# Patient Record
Sex: Female | Born: 1960 | Hispanic: No | Marital: Married | Smoking: Current every day smoker
Health system: Southern US, Community
[De-identification: ages and names within clinical notes are randomized; demographics above are authoritative.]

## PROBLEM LIST (undated history)

## (undated) DIAGNOSIS — F329 Major depressive disorder, single episode, unspecified: Secondary | ICD-10-CM

## (undated) DIAGNOSIS — G8929 Other chronic pain: Secondary | ICD-10-CM

## (undated) DIAGNOSIS — M25473 Effusion, unspecified ankle: Secondary | ICD-10-CM

## (undated) DIAGNOSIS — F32A Depression, unspecified: Secondary | ICD-10-CM

## (undated) DIAGNOSIS — M545 Low back pain: Secondary | ICD-10-CM

## (undated) DIAGNOSIS — M797 Fibromyalgia: Secondary | ICD-10-CM

## (undated) DIAGNOSIS — I10 Essential (primary) hypertension: Secondary | ICD-10-CM

## (undated) DIAGNOSIS — M549 Dorsalgia, unspecified: Secondary | ICD-10-CM

## (undated) DIAGNOSIS — E7800 Pure hypercholesterolemia, unspecified: Secondary | ICD-10-CM

## (undated) HISTORY — DX: Low back pain: M54.5

## (undated) HISTORY — DX: Pure hypercholesterolemia, unspecified: E78.00

## (undated) HISTORY — DX: Other chronic pain: G89.29

## (undated) HISTORY — DX: Depression, unspecified: F32.A

## (undated) HISTORY — PX: CARPAL TUNNEL RELEASE: SHX101

## (undated) HISTORY — DX: Effusion, unspecified ankle: M25.473

## (undated) HISTORY — PX: CHOLECYSTECTOMY: SHX55

## (undated) HISTORY — DX: Major depressive disorder, single episode, unspecified: F32.9

---

## 2014-05-09 ENCOUNTER — Emergency Department (HOSPITAL_COMMUNITY): Payer: Self-pay

## 2014-05-09 ENCOUNTER — Encounter (HOSPITAL_COMMUNITY): Payer: Self-pay | Admitting: Emergency Medicine

## 2014-05-09 ENCOUNTER — Emergency Department (HOSPITAL_COMMUNITY)
Admission: EM | Admit: 2014-05-09 | Discharge: 2014-05-09 | Disposition: A | Discharge status: Home / Self Care | Payer: Self-pay | Attending: Emergency Medicine | Admitting: Emergency Medicine

## 2014-05-09 DIAGNOSIS — F17200 Nicotine dependence, unspecified, uncomplicated: Secondary | ICD-10-CM | POA: Insufficient documentation

## 2014-05-09 DIAGNOSIS — Y9389 Activity, other specified: Secondary | ICD-10-CM | POA: Insufficient documentation

## 2014-05-09 DIAGNOSIS — M549 Dorsalgia, unspecified: Secondary | ICD-10-CM

## 2014-05-09 DIAGNOSIS — IMO0002 Reserved for concepts with insufficient information to code with codable children: Secondary | ICD-10-CM | POA: Insufficient documentation

## 2014-05-09 DIAGNOSIS — Y9289 Other specified places as the place of occurrence of the external cause: Secondary | ICD-10-CM | POA: Insufficient documentation

## 2014-05-09 DIAGNOSIS — G8929 Other chronic pain: Secondary | ICD-10-CM | POA: Insufficient documentation

## 2014-05-09 DIAGNOSIS — W1809XA Striking against other object with subsequent fall, initial encounter: Secondary | ICD-10-CM | POA: Insufficient documentation

## 2014-05-09 DIAGNOSIS — R52 Pain, unspecified: Secondary | ICD-10-CM | POA: Insufficient documentation

## 2014-05-09 DIAGNOSIS — M79609 Pain in unspecified limb: Secondary | ICD-10-CM | POA: Insufficient documentation

## 2014-05-09 DIAGNOSIS — Z79899 Other long term (current) drug therapy: Secondary | ICD-10-CM | POA: Insufficient documentation

## 2014-05-09 DIAGNOSIS — I10 Essential (primary) hypertension: Secondary | ICD-10-CM | POA: Insufficient documentation

## 2014-05-09 HISTORY — DX: Dorsalgia, unspecified: M54.9

## 2014-05-09 HISTORY — DX: Essential (primary) hypertension: I10

## 2014-05-09 HISTORY — DX: Fibromyalgia: M79.7

## 2014-05-09 MED ORDER — MELOXICAM 15 MG PO TABS: 15.0000 mg | ORAL_TABLET | Freq: Every day | ORAL | Status: DC

## 2014-05-09 NOTE — ED Notes (Signed)
Patient discharged using the teach back method She verbalized an understanding.

## 2014-05-09 NOTE — Discharge Instructions (Signed)
Your x rays were negative today for any abnormality If you are continuing to have severe or worsening pain you may need an MRI for further evaluation. It is possible that you have a small fracture that is not visible on xray.  SEEK IMMEDIATE MEDICAL ATTENTION IF: New numbness, tingling, weakness, or problem with the use of your arms or legs.  Severe back pain not relieved with medications.  Change in bowel or bladder control.  Increasing pain in any areas of the body (such as chest or abdominal pain).  Shortness of breath, dizziness or fainting.  Nausea (feeling sick to your stomach), vomiting, fever, or sweats.   Back Pain, Adult Low back pain is very common. About 1 in 5 people have back pain.The cause of low back pain is rarely dangerous. The pain often gets better over time.About half of people with a sudden onset of back pain feel better in just 2 weeks. About 8 in 10 people feel better by 6 weeks.  CAUSES Some common causes of back pain include:  Strain of the muscles or ligaments supporting the spine.  Wear and tear (degeneration) of the spinal discs.  Arthritis.  Direct injury to the back. DIAGNOSIS Most of the time, the direct cause of low back pain is not known.However, back pain can be treated effectively even when the exact cause of the pain is unknown.Answering your caregiver's questions about your overall health and symptoms is one of the most accurate ways to make sure the cause of your pain is not dangerous. If your caregiver needs more information, he or she may order lab work or imaging tests (X-rays or MRIs).However, even if imaging tests show changes in your back, this usually does not require surgery. HOME CARE INSTRUCTIONS For many people, back pain returns.Since low back pain is rarely dangerous, it is often a condition that people can learn to [facility] their own.   Remain active. It is stressful on the back to sit or stand in one place. Do not sit, drive,  or stand in one place for more than 30 minutes at a time. Take short walks on level surfaces as soon as pain allows.Try to increase the length of time you walk each day.  Do not stay in bed.Resting more than 1 or 2 days can delay your recovery.  Do not avoid exercise or work.Your body is made to move.It is not dangerous to be active, even though your back may hurt.Your back will likely heal faster if you return to being active before your pain is gone.  Pay attention to your body when you bend and lift. Many people have less discomfortwhen lifting if they bend their knees, keep the load close to their bodies,and avoid twisting. Often, the most comfortable positions are those that put less stress on your recovering back.  Find a comfortable position to sleep. Use a firm mattress and lie on your side with your knees slightly bent. If you lie on your back, put a pillow under your knees.  Only take over-the-counter or prescription medicines as directed by your caregiver. Over-the-counter medicines to reduce pain and inflammation are often the most helpful.Your caregiver may prescribe muscle relaxant drugs.These medicines help dull your pain so you can more quickly return to your normal activities and healthy exercise.  Put ice on the injured area.  Put ice in a plastic bag.  Place a towel between your skin and the bag.  Leave the ice on for 15-20 minutes, 03-04 times a  day for the first 2 to 3 days. After that, ice and heat may be alternated to reduce pain and spasms.  Ask your caregiver about trying back exercises and gentle massage. This may be of some benefit.  Avoid feeling anxious or stressed.Stress increases muscle tension and can worsen back pain.It is important to recognize when you are anxious or stressed and learn ways to manage it.Exercise is a great option. SEEK MEDICAL CARE IF:  You have pain that is not relieved with rest or medicine.  You have pain that does not  improve in 1 week.  You have new symptoms.  You are generally not feeling well. SEEK IMMEDIATE MEDICAL CARE IF:   You have pain that radiates from your back into your legs.  You develop new bowel or bladder control problems.  You have unusual weakness or numbness in your arms or legs.  You develop nausea or vomiting.  You develop abdominal pain.  You feel faint. Document Released: 08/14/2005 Document Revised: 02/13/2012 Document Reviewed: 12/16/2013 [facility] Patient Information 2015 [place], [place]. This information is not intended to replace advice given to you by your health care provider. Make sure you discuss any questions you have with your health care provider.    Back Exercises Back exercises help treat and prevent back injuries. The goal of back exercises is to increase the strength of your abdominal and back muscles and the flexibility of your back. These exercises should be started when you no longer have back pain. Back exercises include:  Pelvic Tilt. Lie on your back with your knees bent. Tilt your pelvis until the lower part of your back is against the floor. Hold this position 5 to 10 sec and repeat 5 to 10 times.  Knee to Chest. Pull first 1 knee up against your chest and hold for 20 to 30 seconds, repeat this with the other knee, and then both knees. This may be done with the other leg straight or bent, whichever feels better.  Sit-Ups or Curl-Ups. Bend your knees 90 degrees. Start with tilting your pelvis, and do a partial, slow sit-up, lifting your trunk only 30 to 45 degrees off the floor. Take at least 2 to 3 seconds for each sit-up. Do not do sit-ups with your knees out straight. If partial sit-ups are difficult, simply do the above but with only tightening your abdominal muscles and holding it as directed.  Hip-Lift. Lie on your back with your knees flexed 90 degrees. Push down with your feet and shoulders as you raise your hips a couple inches off the floor;  hold for 10 seconds, repeat 5 to 10 times.  Back arches. Lie on your stomach, propping yourself up on bent elbows. Slowly press on your hands, causing an arch in your low back. Repeat 3 to 5 times. Any initial stiffness and discomfort should lessen with repetition over time.  Shoulder-Lifts. Lie face down with arms beside your body. Keep hips and torso pressed to floor as you slowly lift your head and shoulders off the floor. Do not overdo your exercises, especially in the beginning. Exercises may cause you some mild back discomfort which lasts for a few minutes; however, if the pain is more severe, or lasts for more than 15 minutes, do not continue exercises until you see your caregiver. Improvement with exercise therapy for back problems is slow.  See your caregivers for assistance with developing a proper back exercise program. Document Released: 09/21/2004 Document Revised: 11/06/2011 Document Reviewed: 06/15/2011 [employer] Patient  Information 2015 [place], [place]. This information is not intended to replace advice given to you by your health care provider. Make sure you discuss any questions you have with your health care provider.

## 2014-05-09 NOTE — ED Notes (Signed)
Pt. Stated, I fell in bathtub 2 days ago and my back has been hurting so bad.  I went to doctor yesterday in  [place] an d give me Hydrocodone which is not working.  I take other medications for my back problem I've had.

## 2014-05-09 NOTE — ED Provider Notes (Signed)
CSN: [id number]     Arrival date & time 05/09/14  1119 History  This chart was scribed for a non-physician practitioner, Arthor Captain, PA-C working with Glynn Octave, MD by Goodwin Peace, ED Scribe. The patient was seen in TR05C/TR05C. The patient's care was started at 12:36 PM.     Chief Complaint  Patient presents with  . Back Pain  . Fall      The history is provided by the patient. No language interpreter was used.   HPI Comments: Erica Goodwin is a 53 y.o. female who presents to the Emergency Department complaining of back pain onset 2 days that occurred when pt fell in the bathtub. She states that she felt like she broke her back. She describes pain specifically over sacrum and states that it radiates around to the front. She reports waking up this morning in pain, screaming and crying. She notes pain is exacerbated with any of movement all but feels some comfort when she is just sitting still. Pt reports that she was seen yesterday by her regular doctor regarding current problem and was given hydrocodone but denies any relief. She states she has history of chronic back pain and fibromyalgia. Pt is current everyday smoker.    Past Medical History  Diagnosis Date  . Back pain   . Hypertension   . Fibromyalgia    History reviewed. No pertinent past surgical history. No family history on file. History  Substance Use Topics  . Smoking status: Current Every Day Smoker  . Smokeless tobacco: Not on file  . Alcohol Use: No   OB History   Grav Para Term Preterm Abortions TAB SAB Ect Mult Living                 Review of Systems  Constitutional: Negative for fever and chills.  Gastrointestinal: Negative for nausea and vomiting.  Musculoskeletal: Positive for back pain.      Allergies  Sulfa antibiotics  Home Medications   Prior to Admission medications   Medication Sig Start Date End Date Taking? Authorizing Provider  amLODipine (NORVASC) 5 MG tablet Take 5 mg by  mouth daily.   Yes Historical Provider, MD  Cholecalciferol (VITAMIN D-3) 5000 UNITS TABS Take 5,000 Units by mouth daily.   Yes Historical Provider, MD  Cyanocobalamin (VITAMIN B-12) 5000 MCG SUBL Place 5,000 mcg under the tongue daily.   Yes Historical Provider, MD  DULoxetine (CYMBALTA) 60 MG capsule Take 60 mg by mouth daily.   Yes Historical Provider, MD  hydrochlorothiazide (HYDRODIURIL) 25 MG tablet Take 25 mg by mouth daily.   Yes Historical Provider, MD  HYDROcodone-acetaminophen (NORCO/VICODIN) 5-325 MG per tablet Take 1 tablet by mouth every 6 (six) hours as needed for moderate pain.   Yes Historical Provider, MD  ibuprofen (ADVIL,MOTRIN) 600 MG tablet Take 600 mg by mouth every 6 (six) hours as needed (pain).   Yes Historical Provider, MD   BP 160/106  Pulse 80  Temp(Src) 98.1 F (36.7 C) (Oral)  Resp 18  Ht  (1.702 m)  Wt 240 lb (108.863 kg)  BMI 37.58 kg/m2  SpO2 96% Physical Exam  Nursing note and vitals reviewed. Constitutional: She is oriented to person, place, and time. She appears well-developed and well-nourished. No distress.  HENT:  Head: Normocephalic and atraumatic.  Eyes: Conjunctivae and EOM are normal.  Neck: Neck supple. No tracheal deviation present.  Cardiovascular: Normal rate.   Pulmonary/Chest: Effort normal. No respiratory distress.  Musculoskeletal: Normal range of motion.  Neurological: She is alert and oriented to person, place, and time.  Skin: Skin is warm and dry.  Psychiatric: She has a normal mood and affect. Her behavior is normal.    ED Course  Procedures (including critical care time) Labs Review Labs Reviewed - No data to display  No results found for this or any previous visit. No results found.   Imaging Review No results found.   EKG Interpretation None     Medications - No data to display  12:40 PM- Treatment plan was discussed with patient who verbalizes understanding and agrees.   MDM   Final diagnoses:   Fall against object, initial encounter  Acute back pain    Patient with back pain.  No neurological deficits and normal neuro exam.  Patient can walk but states is painful.  No loss of bowel or bladder control.  No concern for cauda equina.  No fever, night sweats, weight loss, h/o cancer, IVDU.  RICE protocol and pain medicine indicated and discussed with patient.    I personally performed the services described in this documentation, which was scribed in my presence. The recorded information has been reviewed and is accurate.      Arthor Captain, PA-C 05/13/14 [phone]

## 2014-05-13 NOTE — ED Provider Notes (Signed)
Medical screening examination/treatment/procedure(s) were performed by non-physician practitioner and as supervising physician I was immediately available for consultation/collaboration.   EKG Interpretation None       Glynn Octave, MD 05/13/14 1037

## 2016-03-25 IMAGING — CR DG SACRUM/COCCYX 2+V
3 series · 3 of 3 positions shown · non-contrast
Comparison: No priors.

CLINICAL DATA: History of trauma from a fall. Severe low back pain.

EXAM:
SACRUM AND COCCYX - 2+ VIEW

[t sacrum a.p.]
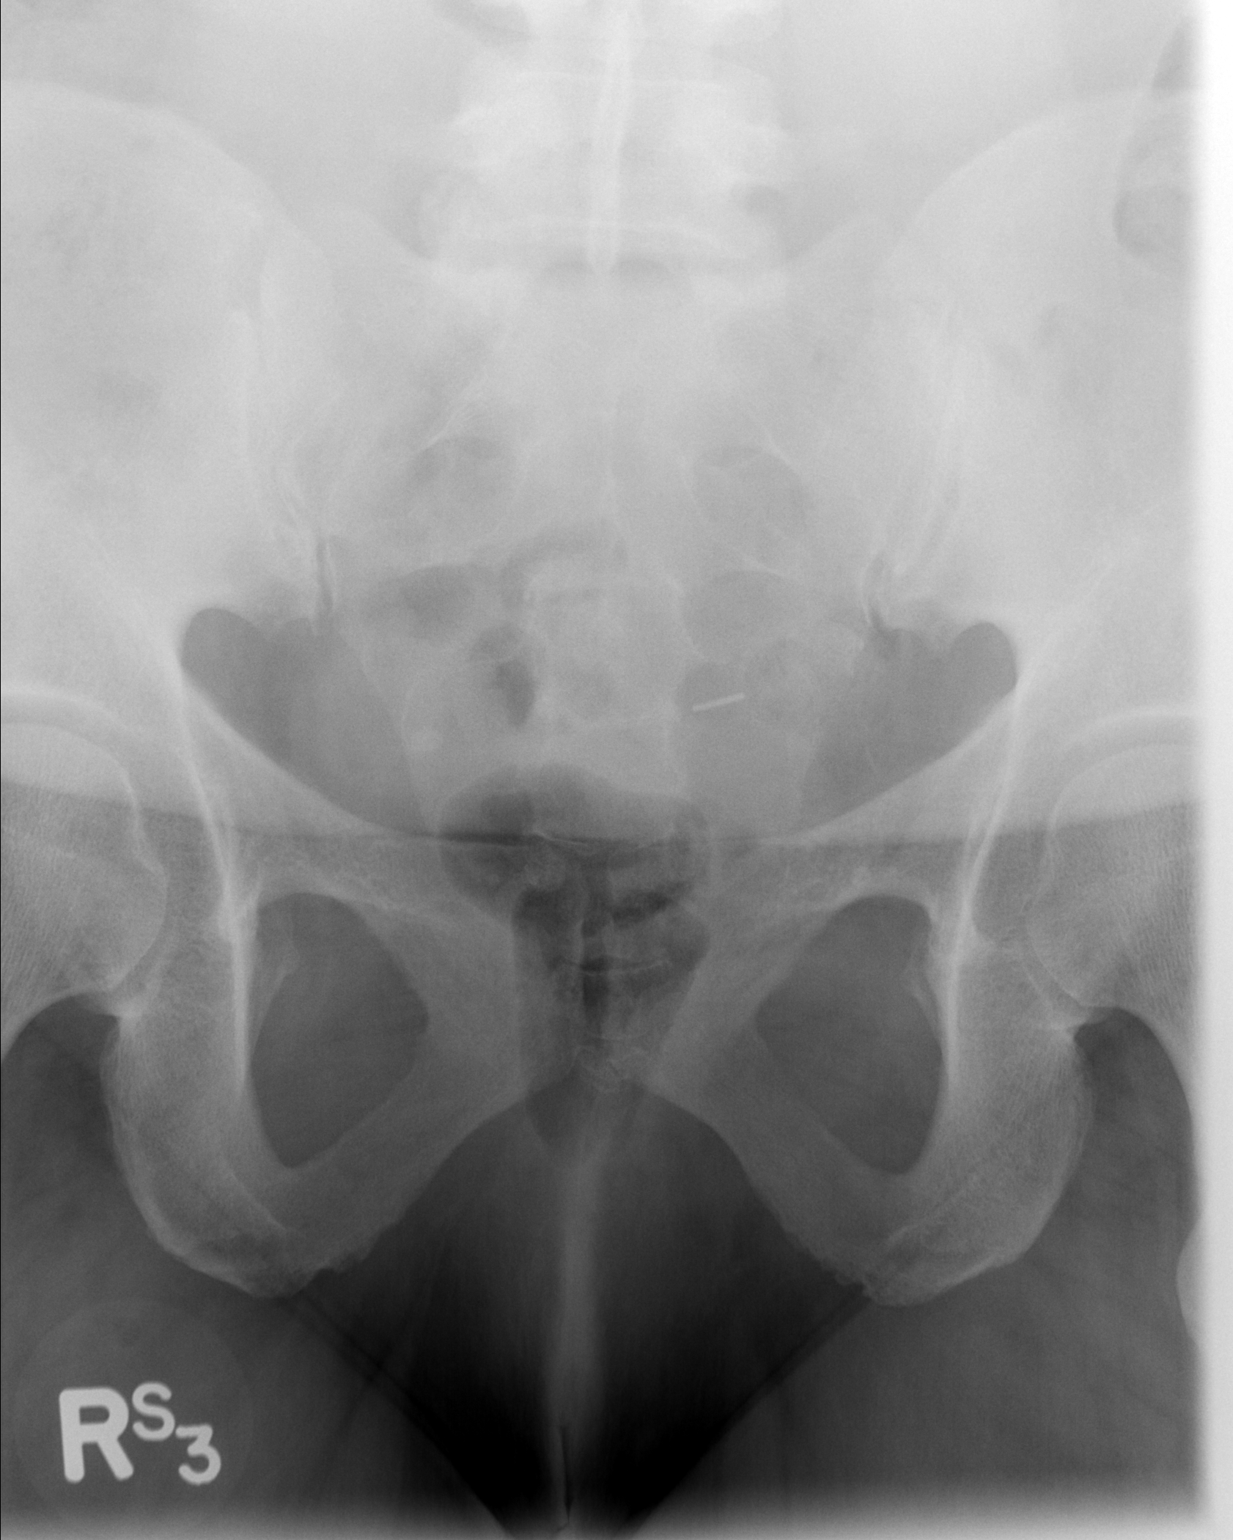

[t coccyx a.p. *]
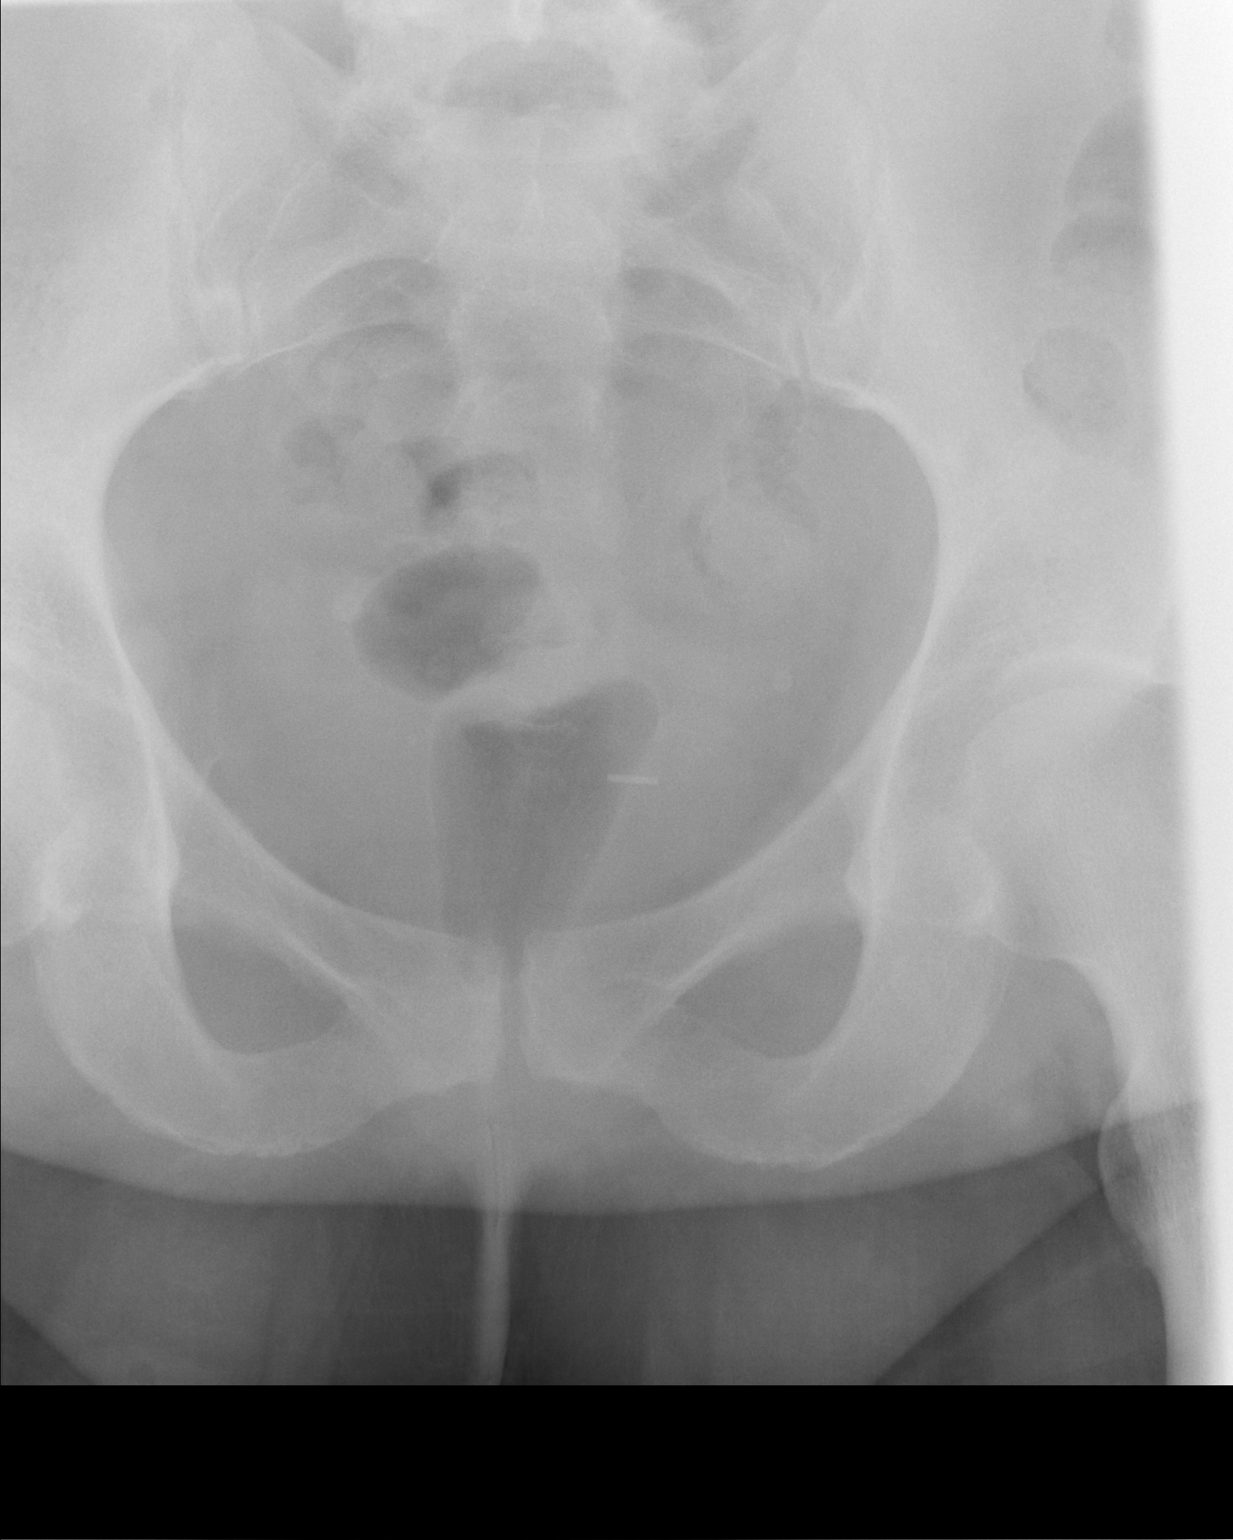

[t sacrum lat]
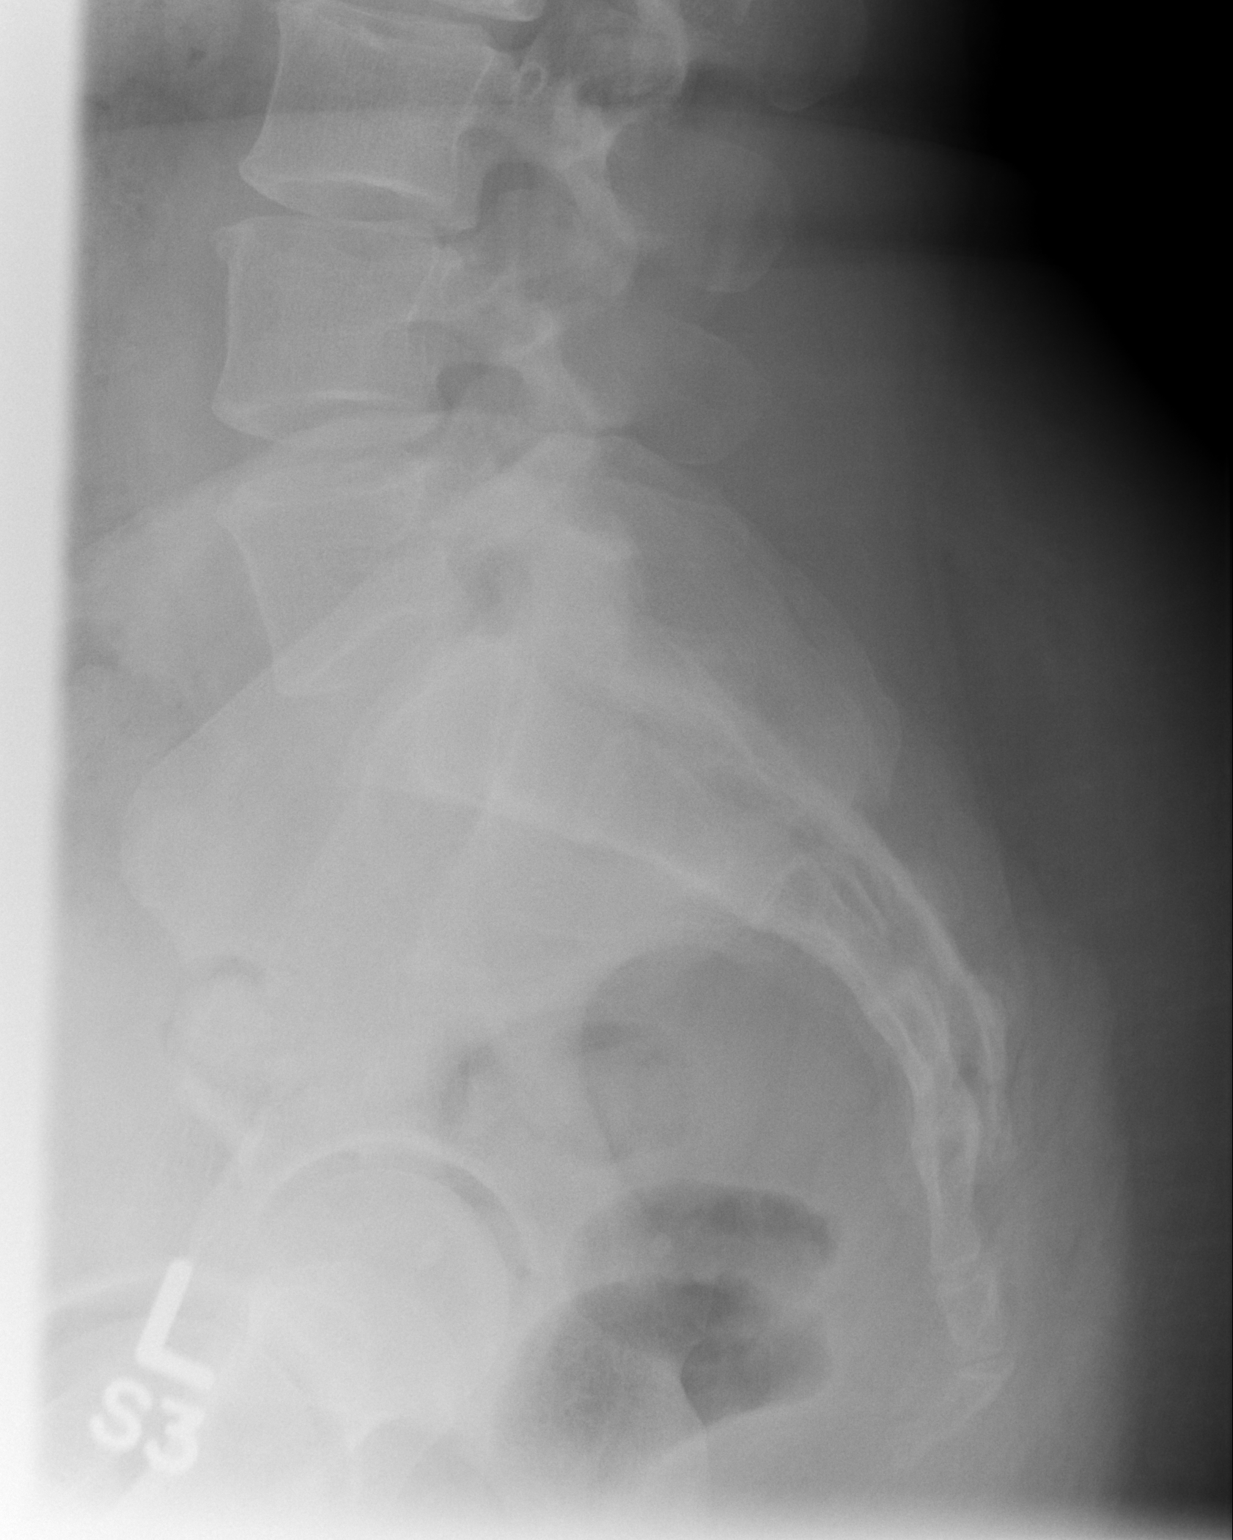

[3 of 3 positions shown; findings below may reference images not displayed]

FINDINGS: Three views of the sacrum and coccyx demonstrate no acute displaced
fractures. A single surgical clip is noted in the left side of
pelvis..
IMPRESSION: 1. No acute radiographic abnormality of the sacrum or coccyx.

## 2016-03-25 IMAGING — CR DG LUMBAR SPINE COMPLETE 4+V
5 series · 5 of 5 positions shown · non-contrast
Comparison: None

CLINICAL DATA: Fall.

EXAM:
LUMBAR SPINE - COMPLETE 4+ VIEW

[t l-spine a.p.]
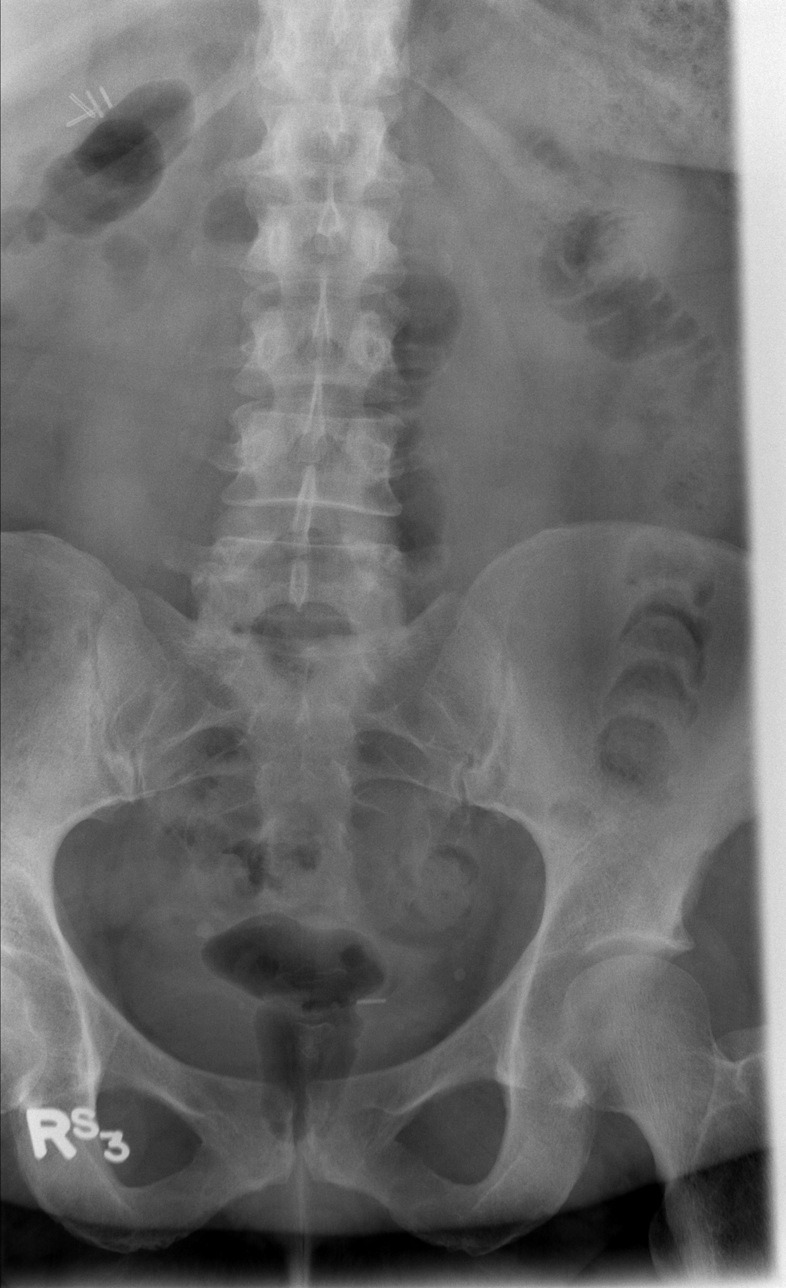

[t l-spine oblique exposure (1 of 2)]
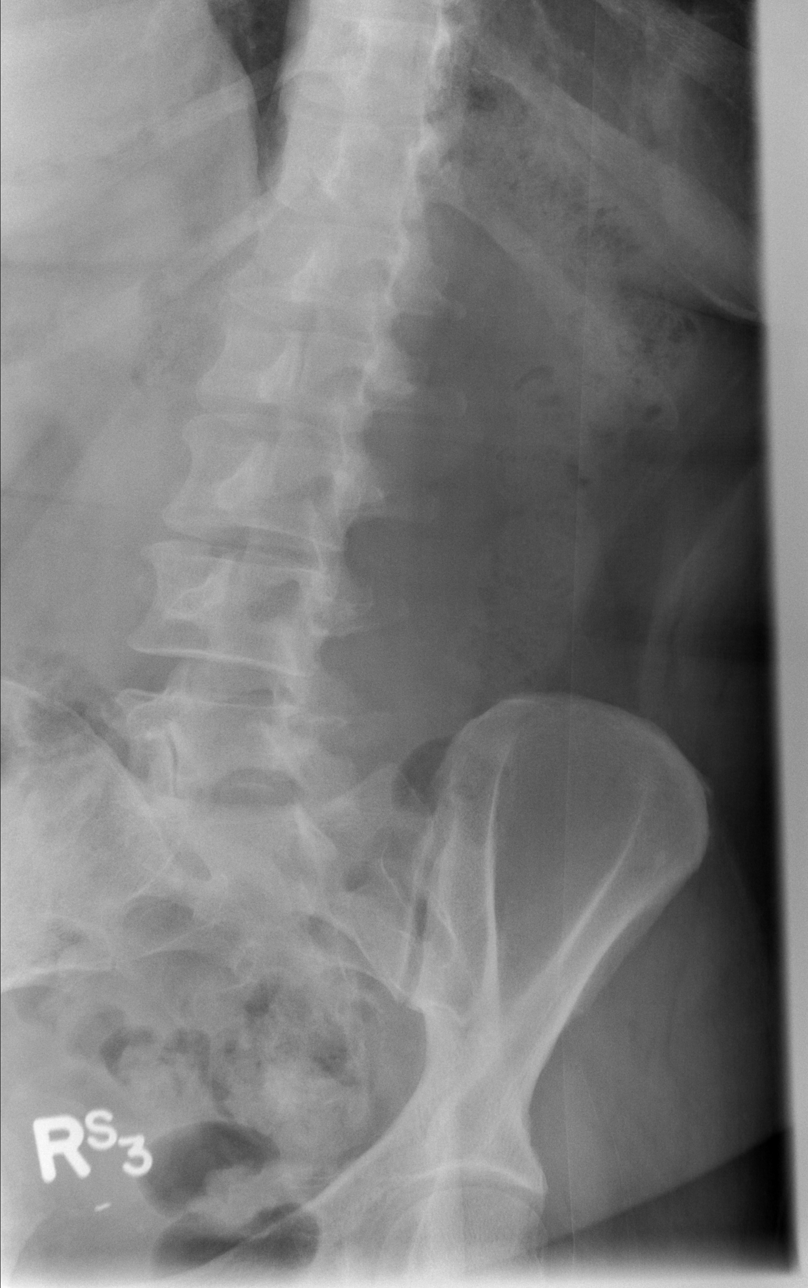

[t l-spine oblique exposure (2 of 2)]
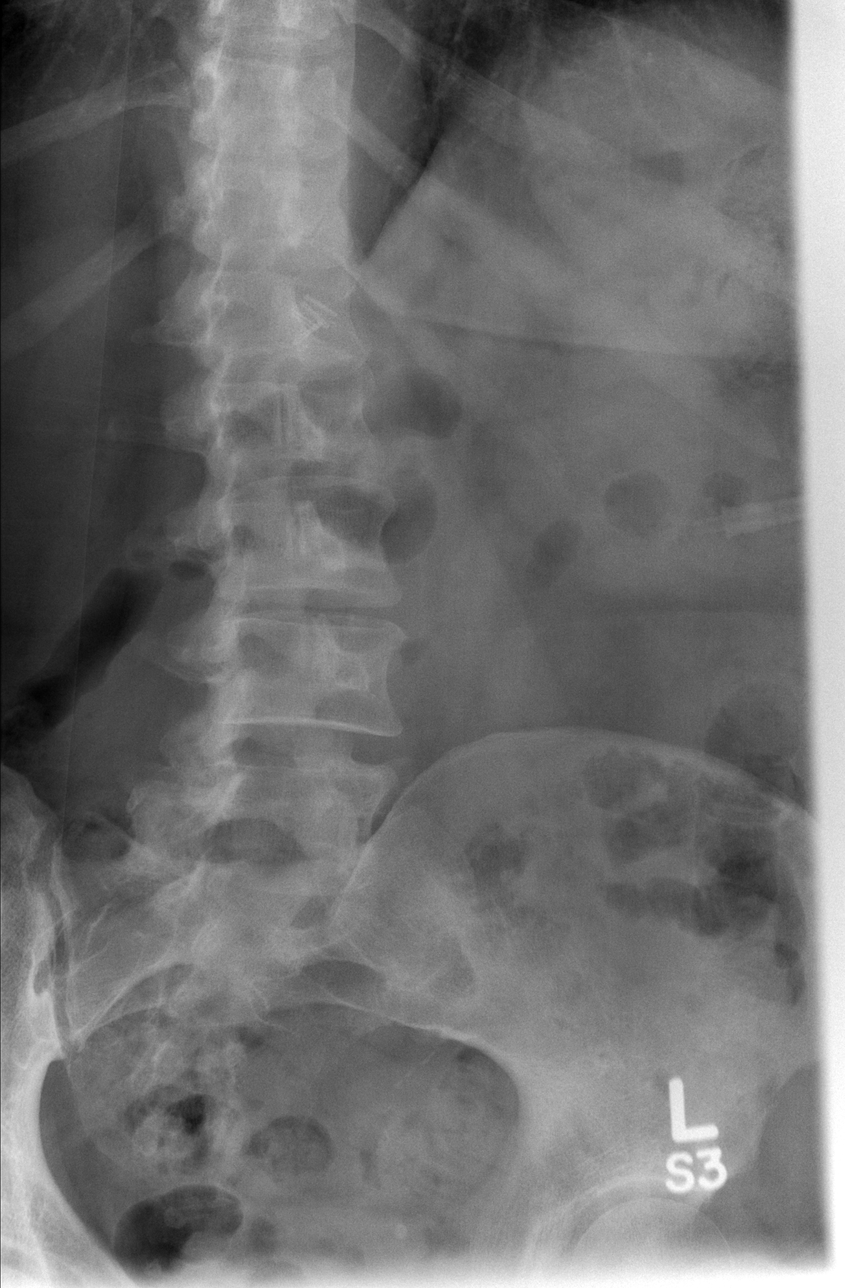

[t l-spine lat]
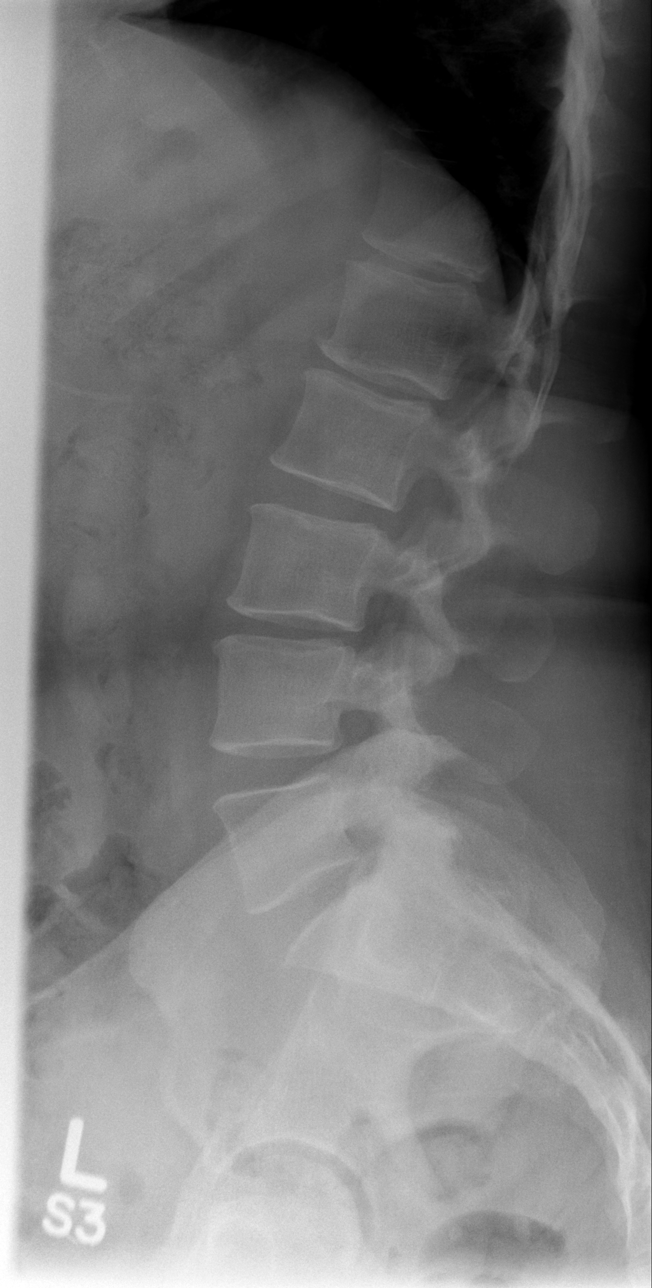

[t l-spine l5-s1 spot]
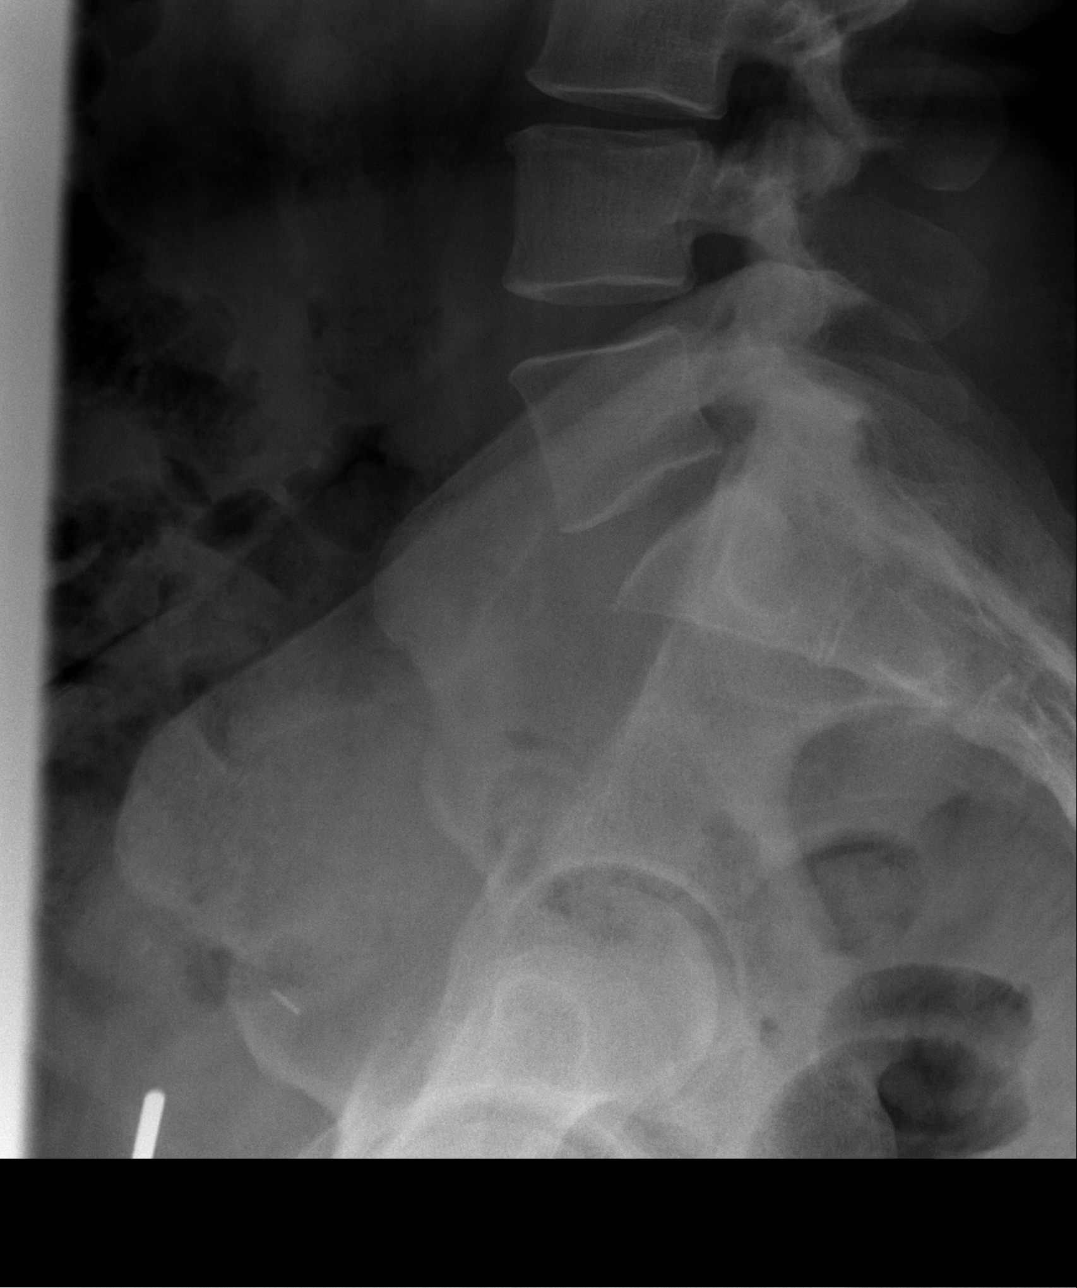

[5 of 5 positions shown; findings below may reference images not displayed]

FINDINGS: Normal alignment of the lumbar spine. The vertebral body heights are
well preserved. Disc space narrowing and ventral endplate spurring
is noted at L1-2 and L3-4. No evidence for fracture.
IMPRESSION: 1. No acute findings.
2. Mild spondylosis.

## 2016-06-07 ENCOUNTER — Telehealth: Payer: Self-pay

## 2016-06-07 ENCOUNTER — Ambulatory Visit (INDEPENDENT_AMBULATORY_CARE_PROVIDER_SITE_OTHER): Payer: BLUE CROSS/BLUE SHIELD | Admitting: Neurology

## 2016-06-07 ENCOUNTER — Encounter: Payer: Self-pay | Admitting: Neurology

## 2016-06-07 VITALS — BP 136/85 | HR 65 | Ht 67.0 in | Wt 240.8 lb

## 2016-06-07 DIAGNOSIS — R202 Paresthesia of skin: Secondary | ICD-10-CM | POA: Diagnosis not present

## 2016-06-07 DIAGNOSIS — G8929 Other chronic pain: Secondary | ICD-10-CM

## 2016-06-07 DIAGNOSIS — M545 Low back pain, unspecified: Secondary | ICD-10-CM | POA: Insufficient documentation

## 2016-06-07 HISTORY — DX: Other chronic pain: G89.29

## 2016-06-07 HISTORY — DX: Low back pain, unspecified: M54.50

## 2016-06-07 NOTE — Telephone Encounter (Signed)
-----   Message from York Spanielharles K Willis, MD sent at 06/07/2016  1:51 PM EDT ----- Please call the [facility] and get the report of EMG and nerve conduction study done recently on this patient faxed to this office. Thank you.

## 2016-06-07 NOTE — Progress Notes (Signed)
Reason for visit: Low back pain  Referring physician:  Dr. Dumonski  Erica Goodwin is a 55 y.o. female  History of present illness:  Erica Goodwin is a 55 year old right-handed white female with a history of obesity and a greater than 5 year history of low back pain. The patient indicates that on 09/18/2014 she fell in the bathtub, and she injured her low back. For several months afterwards she had significant pain and difficulty with walking. The patient has had an evaluation with MRI of the lumbosacral spine that does not show any surgically amenable problems, no evidence of nerve root impingement, but some mild facet joint arthritis has been noted. The patient has had ongoing low back pain across the low back, with pain radiating down the right leg to the foot, and radiating down the left leg to the knee. The patient has a sensation of weakness in the legs, she has developed some numbness in the feet to just above the ankles. The patient feels weak all over. She has some occasional incontinence of the bladder, but no problems with bowel function. The patient has had occasional fall, she has some numbness in the hands, prior history of right carpal tunnel syndrome surgery, she now has numbness of the left hand. She is on gabapentin and Cymbalta for discomfort. She is sent to this office for an evaluation. She indicates that her primary care physician had an EMG and nerve conduction study evaluation done, the results of this study are not available to me. She was apparently told that she had a peripheral neuropathy, she is sent to this office for further evaluation.  Past Medical History:  Diagnosis Date  . Ankle swelling   . Back pain   . Chronic low back pain 06/07/2016  . Depression   . Fibromyalgia   . High cholesterol   . Hypertension     Past Surgical History:  Procedure Laterality Date  . CARPAL TUNNEL RELEASE    . CESAREAN SECTION    . CHOLECYSTECTOMY      Family History    Problem Relation Age of Onset  . Liver cancer Mother   . Breast cancer Mother   . Uterine cancer Mother   . Heart disease Father   . Heart attack Father   . Stroke Father     Social history:  reports that she has been smoking Cigarettes.  She has been smoking about 0.50 packs per day. She has never used smokeless tobacco. She reports that she does not drink alcohol or use drugs.  Medications:  Prior to Admission medications   Medication Sig Start Date End Date Taking? Authorizing Provider  amLODipine (NORVASC) 10 MG tablet Take 10 mg by mouth.   Yes Historical Provider, MD  Cholecalciferol (VITAMIN D-3) 5000 UNITS TABS Take 5,000 Units by mouth daily.   Yes Historical Provider, MD  DULoxetine (CYMBALTA) 60 MG capsule Take 60 mg by mouth daily.   Yes Historical Provider, MD  gabapentin (NEURONTIN) 600 MG tablet Take 1,200 mg by mouth 3 (three) times daily.  04/10/16  Yes Historical Provider, MD  hydrochlorothiazide (HYDRODIURIL) 25 MG tablet Take 25 mg by mouth daily.   Yes Historical Provider, MD  HYDROcodone-acetaminophen (NORCO/VICODIN) 5-325 MG per tablet Take 1 tablet by mouth every 6 (six) hours as needed for moderate pain.   Yes Historical Provider, MD  ibuprofen (ADVIL,MOTRIN) 600 MG tablet  05/21/16  Yes Historical Provider, MD      Allergies  Allergen Reactions  .  Pregabalin   . Promethazine   . Sulfa Antibiotics Hives    ROS:  Out of a complete 14 system review of symptoms, the patient complains only of the following symptoms, and all other reviewed systems are negative.  Weight gain, fatigue Palpitations of the heart, swelling in the legs Difficulty swallowing Blood in the stool, incontinence of the bladder Feeling hot Joint pain, joint swelling, muscle cramps, aching muscles Runny nose Memory loss, numbness, weakness, difficulty swallowing Depression, not enough sleep, decreased energy, disinterest in activities Sleepiness  Blood pressure 136/85, pulse 65,  height 5\' 7"  (1.702 m), weight 240 lb 12 oz (109.2 kg).  Physical Exam  General: The patient is alert and cooperative at the time of the examination. The patient is markedly obese.  Eyes: Pupils are equal, round, and reactive to light. Discs are flat bilaterally.  Neck: The neck is supple, no carotid bruits are noted.  Respiratory: The respiratory examination is clear.  Cardiovascular: The cardiovascular examination reveals a regular rate and rhythm, no obvious murmurs or rubs are noted.  Neuromuscular: The patient has good range of movement of the low back. No significant discomfort with palpation of the low back.  Skin: Extremities are without significant edema.  Neurologic Exam  Mental status: The patient is alert and oriented x 3 at the time of the examination. The patient has apparent normal recent and remote memory, with an apparently normal attention span and concentration ability.  Cranial nerves: Facial symmetry is present. There is good sensation of the face to pinprick and soft touch bilaterally. The strength of the facial muscles and the muscles to head turning and shoulder shrug are normal bilaterally. Speech is well enunciated, no aphasia or dysarthria is noted. Extraocular movements are full. Visual fields are full. The tongue is midline, and the patient has symmetric elevation of the soft palate. No obvious hearing deficits are noted.  Motor: The motor testing reveals 5 over 5 strength of all 4 extremities, but the patient demonstrates giveaway weakness in the legs, no true weakness is seen. Good symmetric motor tone is noted throughout.  Sensory: Sensory testing is intact to pinprick, soft touch, vibration sensation, and position sense on all 4 extremities, with exception of some slight decrease in pinprick sensation to just above the ankles bilaterally, decrease in position sense in both feet. No evidence of extinction is noted.  Coordination: Cerebellar testing reveals  good finger-nose-finger and heel-to-shin bilaterally.  Gait and station: Gait is normal. Tandem gait is slightly unsteady. Romberg is negative. No drift is seen.  Reflexes: Deep tendon reflexes are symmetric and normal bilaterally. The ankle jerk reflexes are well-maintained bilaterally. Toes are downgoing bilaterally.   Assessment/Plan:  1. Chronic low back pain  2. Reported history of peripheral neuropathy  The clinical history and examination do not support the presence of a significant peripheral neuropathy. The patient has well-maintained ankle jerks, the clinical examination shows a lot of giveaway weakness. The history of her pain distribution is not consistent with a peripheral neuropathy. The patient will be set up for blood work today, I will try to get the report of the EMG and nerve conduction study that was done previously.  Marlan Palau MD 06/07/2016 12:05 PM  [facility] [address] [place], [place] [address]  Phone [phone] Fax [phone]

## 2016-06-07 NOTE — Telephone Encounter (Signed)
[facility] [phone] and [phone].

## 2016-06-08 ENCOUNTER — Telehealth: Payer: Self-pay | Admitting: Neurology

## 2016-06-08 DIAGNOSIS — M545 Low back pain: Principal | ICD-10-CM

## 2016-06-08 DIAGNOSIS — G8929 Other chronic pain: Secondary | ICD-10-CM

## 2016-06-08 NOTE — Telephone Encounter (Signed)
EMG and nerve conduction study report is available, done on 05/24/2016. EMG evaluation of both lower extremities were within normal limits, no denervation is seen. Nerve conduction studies done on both legs shows a low motor amplitudes for the peroneal nerve on the left, borderline normal motor amplitude on the right peroneal nerve, normal for the posterior tibial nerves bilaterally. The sensory latencies for the sural and peroneal nerves were normal. H reflex latencies were normal bilaterally. 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  The study does not seem to be consistent with a true peroneal neuropathy as no sensory abnormalities were noted, no denervation acute or chronic is noted in the peroneal nerve distributions bilaterally.  The clinical examination on this patient does not show findings consistent with a peripheral neuropathy.

## 2016-06-08 NOTE — Telephone Encounter (Signed)
Faxed results received and placed in Dr. Anne HahnWillis' office for review.

## 2016-06-12 LAB — ANGIOTENSIN CONVERTING ENZYME: Angio Convert Enzyme: 60 U/L (ref 14–82)

## 2016-06-12 LAB — MULTIPLE MYELOMA PANEL, SERUM
ALBUMIN SERPL ELPH-MCNC: 3.7 g/dL (ref 2.9–4.4)
ALBUMIN/GLOB SERPL: 1.1 (ref 0.7–1.7)
ALPHA 1: 0.3 g/dL (ref 0.0–0.4)
ALPHA2 GLOB SERPL ELPH-MCNC: 0.9 g/dL (ref 0.4–1.0)
B-GLOBULIN SERPL ELPH-MCNC: 1.2 g/dL (ref 0.7–1.3)
GAMMA GLOB SERPL ELPH-MCNC: 1 g/dL (ref 0.4–1.8)
GLOBULIN, TOTAL: 3.5 g/dL (ref 2.2–3.9)
IgA/Immunoglobulin A, Serum: 147 mg/dL (ref 87–352)
IgG (Immunoglobin G), Serum: 882 mg/dL (ref 700–1600)
IgM (Immunoglobulin M), Srm: 297 mg/dL — ABNORMAL HIGH (ref 26–217)
Total Protein: 7.2 g/dL (ref 6.0–8.5)

## 2016-06-12 LAB — RHEUMATOID FACTOR: Rhuematoid fact SerPl-aCnc: <10 IU/mL (ref 0.0–13.9)

## 2016-06-12 LAB — SEDIMENTATION RATE: SED RATE: 8 mm/h (ref 0–40)

## 2016-06-12 LAB — ANA W/REFLEX: ANA: NEGATIVE

## 2016-06-12 LAB — VITAMIN B12: Vitamin B-12: 1037 pg/mL — ABNORMAL HIGH (ref 211–946)

## 2016-06-12 LAB — HEMOGLOBIN A1C
Est. average glucose Bld gHb Est-mCnc: 108 mg/dL
Hgb A1c MFr Bld: 5.4 % (ref 4.8–5.6)

## 2016-06-12 LAB — B. BURGDORFI ANTIBODIES: Lyme IgG/IgM Ab: <0.91 {ISR} (ref 0.00–0.90)

## 2016-06-13 ENCOUNTER — Telehealth: Payer: Self-pay | Admitting: *Deleted

## 2016-06-13 NOTE — Telephone Encounter (Signed)
Called and left detailed VM about lab results per Dr Anne Hahnwillis note. Gave [facility] phone number if she has further questions.

## 2016-06-13 NOTE — Telephone Encounter (Signed)
-----   Message from York Spanielharles K Willis, MD sent at 06/12/2016  5:27 PM EDT -----   The blood work results are unremarkable. Please call the patient.  ----- Message ----- From: Nell RangeInterface, [facility] Lab Results In Sent: 06/08/2016   7:43 AM To: York Spanielharles K Willis, MD

## 2016-06-20 MED ORDER — BACLOFEN 10 MG PO TABS: 5.0000 mg | ORAL_TABLET | Freq: Two times a day (BID) | ORAL | 1 refills | Status: AC

## 2016-06-20 NOTE — Telephone Encounter (Signed)
I called patient. The patient is having some ongoing muscle cramps. The patient will be placed on baclofen.  The patient will be set up for nerve muscle therapy on the low back. She is also reporting some numbness in the hands, EMG and nerve conduction study evaluation of the arms may need to be done in the future to look for carpal tunnel syndrome.

## 2016-06-20 NOTE — Telephone Encounter (Signed)
Patient called to request results of EMG and if these results were sent to PCP. Patient also states she is having a lot of cramping in legs, hips and thighs. Please call [phone].

## 2016-06-20 NOTE — Addendum Note (Signed)
Addended by: Stephanie AcreWILLIS, Dorine Duffey on: 06/20/2016 05:11 PM   Modules accepted: Orders

## 2016-07-24 ENCOUNTER — Telehealth: Payer: Self-pay | Admitting: Neurology

## 2016-07-25 ENCOUNTER — Ambulatory Visit: Payer: BLUE CROSS/BLUE SHIELD | Admitting: Neurology

## 2016-07-25 ENCOUNTER — Telehealth: Payer: Self-pay

## 2016-07-25 NOTE — Telephone Encounter (Signed)
error 

## 2016-07-25 NOTE — Telephone Encounter (Signed)
Pt no-showed her appt this morning. 

## 2016-07-27 ENCOUNTER — Encounter: Payer: Self-pay | Admitting: Neurology

## 2021-03-14 DIAGNOSIS — Z01818 Encounter for other preprocedural examination: Secondary | ICD-10-CM

## 2022-01-31 ENCOUNTER — Other Ambulatory Visit: Payer: Self-pay | Admitting: Specialist

## 2022-01-31 DIAGNOSIS — Z87891 Personal history of nicotine dependence: Secondary | ICD-10-CM

## 2022-02-23 ENCOUNTER — Other Ambulatory Visit: Payer: Self-pay

## 2022-10-31 ENCOUNTER — Other Ambulatory Visit: Payer: Self-pay | Admitting: Family

## 2022-10-31 DIAGNOSIS — Z87891 Personal history of nicotine dependence: Secondary | ICD-10-CM

## 2023-02-21 ENCOUNTER — Inpatient Hospital Stay: Admission: RE | Admit: 2023-02-21 | Payer: Managed Care, Other (non HMO) | Source: Ambulatory Visit

## 2023-04-20 ENCOUNTER — Other Ambulatory Visit: Payer: Self-pay | Admitting: Family

## 2023-04-20 ENCOUNTER — Ambulatory Visit
Admission: RE | Admit: 2023-04-20 | Discharge: 2023-04-20 | Disposition: A | Discharge status: Home / Self Care | Payer: Managed Care, Other (non HMO) | Source: Ambulatory Visit | Attending: Family | Admitting: Family

## 2023-04-20 DIAGNOSIS — Z1231 Encounter for screening mammogram for malignant neoplasm of breast: Secondary | ICD-10-CM

## 2024-05-09 ENCOUNTER — Other Ambulatory Visit: Payer: Self-pay | Admitting: Family

## 2024-05-09 DIAGNOSIS — Z1231 Encounter for screening mammogram for malignant neoplasm of breast: Secondary | ICD-10-CM

## 2024-05-16 ENCOUNTER — Inpatient Hospital Stay: Admission: RE | Admit: 2024-05-16 | Payer: Managed Care, Other (non HMO) | Source: Ambulatory Visit
